# Patient Record
Sex: Male | Born: 1950 | Race: White | Hispanic: No | Marital: Married | Smoking: Current every day smoker
Health system: Southern US, Community
[De-identification: ages and names within clinical notes are randomized; demographics above are authoritative.]

## PROBLEM LIST (undated history)

## (undated) ENCOUNTER — Emergency Department (HOSPITAL_COMMUNITY): Admission: EM | Discharge status: Absent without leave | Payer: Medicare Other

## (undated) DIAGNOSIS — M1990 Unspecified osteoarthritis, unspecified site: Secondary | ICD-10-CM

## (undated) DIAGNOSIS — G893 Neoplasm related pain (acute) (chronic): Secondary | ICD-10-CM

## (undated) DIAGNOSIS — R918 Other nonspecific abnormal finding of lung field: Secondary | ICD-10-CM

## (undated) DIAGNOSIS — R112 Nausea with vomiting, unspecified: Secondary | ICD-10-CM

## (undated) DIAGNOSIS — F1021 Alcohol dependence, in remission: Secondary | ICD-10-CM

## (undated) DIAGNOSIS — E119 Type 2 diabetes mellitus without complications: Secondary | ICD-10-CM

## (undated) DIAGNOSIS — D6959 Other secondary thrombocytopenia: Secondary | ICD-10-CM

## (undated) DIAGNOSIS — J449 Chronic obstructive pulmonary disease, unspecified: Secondary | ICD-10-CM

## (undated) DIAGNOSIS — J90 Pleural effusion, not elsewhere classified: Secondary | ICD-10-CM

## (undated) DIAGNOSIS — I890 Lymphedema, not elsewhere classified: Secondary | ICD-10-CM

## (undated) DIAGNOSIS — E02 Subclinical iodine-deficiency hypothyroidism: Secondary | ICD-10-CM

## (undated) DIAGNOSIS — L02419 Cutaneous abscess of limb, unspecified: Secondary | ICD-10-CM

## (undated) DIAGNOSIS — D759 Disease of blood and blood-forming organs, unspecified: Secondary | ICD-10-CM

## (undated) DIAGNOSIS — Z720 Tobacco use: Secondary | ICD-10-CM

## (undated) DIAGNOSIS — C3490 Malignant neoplasm of unspecified part of unspecified bronchus or lung: Secondary | ICD-10-CM

## (undated) HISTORY — DX: Unspecified osteoarthritis, unspecified site: M19.90

## (undated) HISTORY — PX: LUNG CANCER SURGERY: SHX702

## (undated) HISTORY — DX: Subclinical iodine-deficiency hypothyroidism: E02

## (undated) HISTORY — DX: Chronic obstructive pulmonary disease, unspecified: J44.9

---
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
## 2006-01-23 ENCOUNTER — Emergency Department (HOSPITAL_COMMUNITY): Admission: EM | Admit: 2006-01-23 | Discharge: 2006-01-23 | Payer: Self-pay | Admitting: Emergency Medicine

## 2018-06-03 DIAGNOSIS — R918 Other nonspecific abnormal finding of lung field: Secondary | ICD-10-CM | POA: Insufficient documentation

## 2018-06-16 DIAGNOSIS — Z720 Tobacco use: Secondary | ICD-10-CM | POA: Insufficient documentation

## 2018-06-16 DIAGNOSIS — R918 Other nonspecific abnormal finding of lung field: Secondary | ICD-10-CM

## 2018-06-16 DIAGNOSIS — C3490 Malignant neoplasm of unspecified part of unspecified bronchus or lung: Secondary | ICD-10-CM

## 2018-06-16 DIAGNOSIS — E119 Type 2 diabetes mellitus without complications: Secondary | ICD-10-CM | POA: Insufficient documentation

## 2018-06-16 DIAGNOSIS — F1021 Alcohol dependence, in remission: Secondary | ICD-10-CM

## 2018-06-16 HISTORY — DX: Other nonspecific abnormal finding of lung field: R91.8

## 2018-06-16 HISTORY — DX: Type 2 diabetes mellitus without complications: E11.9

## 2018-06-16 HISTORY — DX: Tobacco use: Z72.0

## 2018-06-16 HISTORY — DX: Alcohol dependence, in remission: F10.21

## 2018-06-16 HISTORY — DX: Malignant neoplasm of unspecified part of unspecified bronchus or lung: C34.90

## 2018-07-02 DIAGNOSIS — J9819 Other pulmonary collapse: Secondary | ICD-10-CM | POA: Insufficient documentation

## 2018-07-03 DIAGNOSIS — J9600 Acute respiratory failure, unspecified whether with hypoxia or hypercapnia: Secondary | ICD-10-CM | POA: Insufficient documentation

## 2018-07-09 DIAGNOSIS — J90 Pleural effusion, not elsewhere classified: Secondary | ICD-10-CM

## 2018-07-09 HISTORY — DX: Pleural effusion, not elsewhere classified: J90

## 2018-07-16 DIAGNOSIS — G893 Neoplasm related pain (acute) (chronic): Secondary | ICD-10-CM

## 2018-07-16 HISTORY — DX: Neoplasm related pain (acute) (chronic): G89.3

## 2018-08-20 DIAGNOSIS — T451X5A Adverse effect of antineoplastic and immunosuppressive drugs, initial encounter: Secondary | ICD-10-CM | POA: Insufficient documentation

## 2018-08-20 DIAGNOSIS — D6959 Other secondary thrombocytopenia: Secondary | ICD-10-CM

## 2018-08-20 HISTORY — DX: Adverse effect of antineoplastic and immunosuppressive drugs, initial encounter: T45.1X5A

## 2018-08-20 HISTORY — DX: Other secondary thrombocytopenia: D69.59

## 2018-08-24 DIAGNOSIS — Z09 Encounter for follow-up examination after completed treatment for conditions other than malignant neoplasm: Secondary | ICD-10-CM | POA: Insufficient documentation

## 2018-10-19 ENCOUNTER — Other Ambulatory Visit: Payer: Self-pay | Admitting: Oncology

## 2018-10-19 ENCOUNTER — Other Ambulatory Visit (HOSPITAL_COMMUNITY): Payer: Self-pay | Admitting: Oncology

## 2018-10-19 DIAGNOSIS — C3400 Malignant neoplasm of unspecified main bronchus: Secondary | ICD-10-CM

## 2018-10-29 ENCOUNTER — Encounter (HOSPITAL_COMMUNITY): Payer: Self-pay

## 2018-11-12 DIAGNOSIS — R112 Nausea with vomiting, unspecified: Secondary | ICD-10-CM | POA: Insufficient documentation

## 2018-11-12 DIAGNOSIS — T451X5A Adverse effect of antineoplastic and immunosuppressive drugs, initial encounter: Secondary | ICD-10-CM | POA: Insufficient documentation

## 2018-11-12 HISTORY — DX: Nausea with vomiting, unspecified: R11.2

## 2019-01-21 DIAGNOSIS — T50905A Adverse effect of unspecified drugs, medicaments and biological substances, initial encounter: Secondary | ICD-10-CM | POA: Insufficient documentation

## 2019-01-21 DIAGNOSIS — D759 Disease of blood and blood-forming organs, unspecified: Secondary | ICD-10-CM

## 2019-01-21 HISTORY — DX: Disease of blood and blood-forming organs, unspecified: D75.9

## 2019-03-15 DIAGNOSIS — L02419 Cutaneous abscess of limb, unspecified: Secondary | ICD-10-CM | POA: Insufficient documentation

## 2019-03-15 DIAGNOSIS — L03119 Cellulitis of unspecified part of limb: Secondary | ICD-10-CM | POA: Insufficient documentation

## 2019-04-15 ENCOUNTER — Other Ambulatory Visit (HOSPITAL_COMMUNITY): Payer: Self-pay | Admitting: Radiation Oncology

## 2019-04-15 ENCOUNTER — Other Ambulatory Visit: Payer: Self-pay | Admitting: Radiation Oncology

## 2019-04-15 DIAGNOSIS — C3490 Malignant neoplasm of unspecified part of unspecified bronchus or lung: Secondary | ICD-10-CM

## 2019-04-23 ENCOUNTER — Encounter (HOSPITAL_COMMUNITY): Admission: RE | Admit: 2019-04-23 | Payer: Self-pay | Source: Ambulatory Visit

## 2019-04-23 ENCOUNTER — Encounter (HOSPITAL_COMMUNITY): Payer: Self-pay

## 2019-04-27 ENCOUNTER — Other Ambulatory Visit: Payer: Self-pay

## 2019-04-27 ENCOUNTER — Ambulatory Visit (HOSPITAL_COMMUNITY)
Admission: RE | Admit: 2019-04-27 | Discharge: 2019-04-27 | Disposition: A | Discharge status: Home / Self Care | Payer: No Typology Code available for payment source | Source: Ambulatory Visit | Attending: Radiation Oncology | Admitting: Radiation Oncology

## 2019-04-27 DIAGNOSIS — C3490 Malignant neoplasm of unspecified part of unspecified bronchus or lung: Secondary | ICD-10-CM | POA: Insufficient documentation

## 2019-04-27 LAB — GLUCOSE, CAPILLARY: Glucose-Capillary: 78 mg/dL (ref 70–99)

## 2019-04-27 MED ORDER — FLUDEOXYGLUCOSE F - 18 (FDG) INJECTION
7.7700 | Freq: Once | INTRAVENOUS | Status: AC | PRN
  Administered 2019-04-27: 7.77 via INTRAVENOUS

## 2019-05-13 DIAGNOSIS — I890 Lymphedema, not elsewhere classified: Secondary | ICD-10-CM | POA: Insufficient documentation

## 2019-05-13 DIAGNOSIS — L02419 Cutaneous abscess of limb, unspecified: Secondary | ICD-10-CM

## 2019-05-13 DIAGNOSIS — L03119 Cellulitis of unspecified part of limb: Secondary | ICD-10-CM

## 2019-05-13 HISTORY — DX: Lymphedema, not elsewhere classified: I89.0

## 2019-05-13 HISTORY — DX: Cellulitis of unspecified part of limb: L03.119

## 2019-05-13 HISTORY — DX: Cutaneous abscess of limb, unspecified: L02.419

## 2019-05-27 ENCOUNTER — Other Ambulatory Visit: Payer: Self-pay | Admitting: Family Medicine

## 2019-05-27 ENCOUNTER — Other Ambulatory Visit (HOSPITAL_COMMUNITY): Payer: Self-pay | Admitting: Family Medicine

## 2019-05-27 DIAGNOSIS — R918 Other nonspecific abnormal finding of lung field: Secondary | ICD-10-CM

## 2019-05-31 ENCOUNTER — Other Ambulatory Visit: Payer: Self-pay

## 2019-05-31 ENCOUNTER — Ambulatory Visit (HOSPITAL_COMMUNITY)
Admission: RE | Admit: 2019-05-31 | Discharge: 2019-05-31 | Disposition: A | Discharge status: Home / Self Care | Payer: No Typology Code available for payment source | Source: Ambulatory Visit | Attending: Family Medicine | Admitting: Family Medicine

## 2019-05-31 DIAGNOSIS — R918 Other nonspecific abnormal finding of lung field: Secondary | ICD-10-CM | POA: Diagnosis not present

## 2019-05-31 LAB — POCT I-STAT CREATININE: Creatinine, Ser: 1.3 mg/dL — ABNORMAL HIGH (ref 0.61–1.24)

## 2019-05-31 MED ORDER — IOHEXOL 300 MG/ML  SOLN
75.0000 mL | Freq: Once | INTRAMUSCULAR | Status: AC | PRN
  Administered 2019-05-31: 14:00:00 75 mL via INTRAVENOUS

## 2019-06-03 ENCOUNTER — Other Ambulatory Visit: Payer: Self-pay

## 2019-06-03 DIAGNOSIS — E02 Subclinical iodine-deficiency hypothyroidism: Secondary | ICD-10-CM | POA: Insufficient documentation

## 2019-06-04 ENCOUNTER — Encounter: Payer: Self-pay | Admitting: Thoracic Surgery (Cardiothoracic Vascular Surgery)

## 2019-06-04 ENCOUNTER — Other Ambulatory Visit: Payer: Self-pay

## 2019-06-04 ENCOUNTER — Institutional Professional Consult (permissible substitution) (INDEPENDENT_AMBULATORY_CARE_PROVIDER_SITE_OTHER): Payer: No Typology Code available for payment source | Admitting: Thoracic Surgery (Cardiothoracic Vascular Surgery)

## 2019-06-04 ENCOUNTER — Other Ambulatory Visit: Payer: Self-pay | Admitting: *Deleted

## 2019-06-04 VITALS — BP 93/57 | HR 64 | Temp 97.8°F | Resp 20 | Ht 70.0 in | Wt 154.0 lb

## 2019-06-04 DIAGNOSIS — J90 Pleural effusion, not elsewhere classified: Secondary | ICD-10-CM | POA: Diagnosis not present

## 2019-06-04 DIAGNOSIS — Z85118 Personal history of other malignant neoplasm of bronchus and lung: Secondary | ICD-10-CM

## 2019-06-04 NOTE — Progress Notes (Signed)
[address]       [place] [address]             [phone]                    Rishi Scroggins [facility] Medical Record #[id number] Date of Birth: 04-05-1951  Referring: Meda Klinefelter, MD Primary Care: Default, Provider, MD Primary Cardiologist: No primary care provider on file.  Chief Complaint:    Chief Complaint  Patient presents with  . Pleural Effusion    consult on removing right pleurX catheter in was placed last year Dr Tye [place] in [place] 05/31/19, PET Scan 04/27/19, currently having chemo/ therapy      History of Present Illness:    Marcus Schmidt 68 y.o. male for a right Pleurx catheter removal.  He has a history of stage IIIa non-small cell lung cancer right lung, and had a malignant pleural effusion as well.  This was originally treated at [facility] with catheter-based drainage and pleurodesis.  At that time a Pleurx catheter was placed.  This occurred in July 2020.  He has had minimal drainage from his Pleurx catheter since that point.  On most recent CT scan, he has good pleural apposition with no evidence of residual fluid.       Zubrod Score: At the time of surgery this patient's most appropriate activity status/level should be described as: []     0    Normal activity, no symptoms []     1    Restricted in physical strenuous activity but ambulatory, able to do out light work [x]     2    Ambulatory and capable of self care, unable to do work activities, up and about               >50 % of waking hours                              []     3    Only limited self care, in bed greater than 50% of waking hours []     4    Completely disabled, no self care, confined to bed or chair []     5    Moribund   Past Medical History:  Diagnosis Date  . Arthritis   . Cancer related pain 07/16/2018   per [facility] records  . Cellulitis and abscess of leg 05/13/2019   per [facility] records  . Chemotherapy induced nausea and vomiting 11/12/2018   per [facility] records   . Chemotherapy-induced thrombocytopenia 08/20/2018   per [facility] records  . COPD (chronic obstructive pulmonary disease) ([place])   . Diabetes mellitus ([place]) 06/16/2018  . Diabetes mellitus ([place]) 06/16/2018   per [facility] records  . Drug-induced cytopenia 01/21/2019   per [facility] records  . 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 Endobronchial mass 10/919   per [facility] records  . History of alcoholism ([place]) 06/16/2018   per [facility] records  . Lung cancer ([place]) 06/16/2018   [facility]  . Lymphedema of both lower extremities 05/13/2019   per [facility] records  . Pleural effusion 07/09/2018   per [facility] records  . Subclinical iodine-deficiency hypothyroidism    per [facility] records  . Tobacco use 06/16/2018   [facility]    No family history on file.   Social History   Tobacco Use  Smoking Status  Current Every Day Smoker  . Types: Cigarettes    Social History   Substance and Sexual Activity  Alcohol Use Not Currently     Allergies  Allergen Reactions  . Ceftriaxone Rash  . Ciprofloxacin Rash  . Penicillins Rash    Current Outpatient Medications  Medication Sig Dispense Refill  . albuterol (VENTOLIN HFA) 108 (90 Base) MCG/ACT inhaler Inhale into the lungs every 4 (four) hours as needed.     Marland Kitchen aspirin-acetaminophen-caffeine (EXCEDRIN MIGRAINE) 250-250-65 MG tablet Take by mouth.    . budesonide-formoterol (SYMBICORT) 80-4.5 MCG/ACT inhaler Inhale into the lungs as needed.     . docusate sodium (COLACE) 100 MG capsule Take by mouth.    . doxycycline (VIBRAMYCIN) 100 MG capsule Take 100 mg by mouth 2 (two) times daily.     . finasteride (PROSCAR) 5 MG tablet Take 1 tablet by mouth daily.    Marland Kitchen gabapentin (NEURONTIN) 400 MG capsule Take 2 capsules by mouth 3 (three) times daily. 2 caps TID    . loratadine (CLARITIN) 10 MG tablet Take 1 tablet by mouth daily as needed.    . metFORMIN (GLUCOPHAGE) 500 MG tablet Take 1,000 mg by mouth 2 (two) times daily.     Marland Kitchen morphine (MS CONTIN) 15  MG 12 hr tablet Take 1 tablet by mouth every 12 (twelve) hours.    Marland Kitchen morphine (MSIR) 15 MG tablet Take 0.5 tablets by mouth every 6 (six) hours as needed for pain.    . Multiple Vitamins-Minerals (MULTIVITAMIN ADULT PO) Take 1 tablet by mouth daily.    . promethazine (PHENERGAN) 25 MG tablet Take 1 tablet by mouth every 6 (six) hours as needed for nausea.    . tamsulosin (FLOMAX) 0.4 MG CAPS capsule Take 1 capsule by mouth daily.    Marland Kitchen triamterene-hydrochlorothiazide (DYAZIDE) 37.5-25 MG capsule Take 1 capsule by mouth daily. Prn swelling in legs      No current facility-administered medications for this visit.     Review of Systems  Constitutional: Negative.   Respiratory: Positive for cough and shortness of breath.   Cardiovascular: Positive for chest pain.  Musculoskeletal: Positive for back pain.  Neurological: Negative.      PHYSICAL EXAMINATION: BP (!) 93/57   Pulse 64   Temp 97.8 F (36.6 C) (Skin)   Resp 20   Ht 5\' 10"  (1.778 m)   Wt 154 lb (69.9 kg)   SpO2 95% Comment: RA  BMI 22.10 kg/m  Physical Exam  Constitutional: He is oriented to person, place, and time.  disheveled Presents in a wheelchair  HENT:  Head: Normocephalic and atraumatic.  Eyes: Conjunctivae and EOM are normal. No scleral icterus.  Neck: Normal range of motion. No tracheal deviation present.  Cardiovascular: Normal rate.  Respiratory: Effort normal. No respiratory distress.  GI: He exhibits no distension.  Neurological: He is alert and oriented to person, place, and time.  Skin: Skin is warm and dry.    Diagnostic Studies & Laboratory data:     Recent Radiology Findings:   Ct Chest W Contrast  Result Date: 06/01/2019 CLINICAL DATA:  Lung cancer, prior radiation therapy. EXAM: CT CHEST WITH CONTRAST TECHNIQUE: Multidetector CT imaging of the chest was performed during intravenous contrast administration. CONTRAST:  31mL OMNIPAQUE IOHEXOL 300 MG/ML  SOLN COMPARISON:  PET-CT from 04/27/2019  and CT scan from 03/02/2019 FINDINGS: Cardiovascular: Coronary, aortic arch, and branch vessel atherosclerotic vascular disease. Left Port-A-Cath tip: SVC. Mediastinum/Nodes: Stable volume loss on the right with resulting  mild rightward mediastinal shift. Right mediastinal soft tissue prominence extending around the right upper lobe bronchus and bronchus intermedius and indistinct stranding in the regional mediastinum, similar to 03/02/2019. Lungs/Pleura: Increasing right perihilar airspace opacity particularly posteriorly in the right upper lobe for example on image 65/4. Compared to the CT from 03/02/2019 is mildly reduced volume loss in the right middle lobe and a similar amount of central peribronchovascular density in the right lower lobe. Cylindrical bronchiectasis noted with bilateral airway thickening. There is a band of atelectasis in the right lower lobe which appears stable. A right pleural drain is in place and there is no significant right pleural effusion at this time. There is a band of airspace opacity in the lingula for example on image 132/4 which is new compared to recent prior exams. Branching indistinct opacities in the posterior basal segment left lower lobe appear inflammatory. Some of the more confluent airspace opacity medially in the left lower lobe is less dense than it was on 03/01/2029. Upper Abdomen: Unremarkable Musculoskeletal: Old healed right rib fractures. Mild thoracic spondylosis. IMPRESSION: 1. Increasing right perihilar airspace opacity likely associated with prior radiation therapy. Similar right eccentric mediastinal density compared to 03/02/2019. 2. The band of airspace opacity in the left lower lobe medially is improved. There is some scattered branching opacities in the posterior basal segment left lower lobe which are likely inflammatory. 3. New band of opacity in the lingula, probably from atelectasis or less likely pneumonia/inflammation. 4. Bilateral cylindrical  bronchiectasis with airway thickening. 5. Right pleural drain is in place. 6.  Aortic Atherosclerosis (ICD10-I70.0).  Coronary atherosclerosis. Electronically Signed   By: Van Clines M.D.   On: 06/01/2019 08:36       I have independently reviewed the above radiology studies  and reviewed the findings with the patient.   Recent Lab Findings: Lab Results  Component Value Date   CREATININE 1.30 (H) 05/31/2019      Assessment / Plan:   68 year old gentleman with history of stage IIIa lung cancer status post chemotherapy and radiation to the right  chest.  A Pleurx catheter was placed in July 2020 which is ready for removal given that there is no longer any fluid on cross-sectional imaging.  The felt feels close to the skin, thus this will be removed Next Wednesday in the minor procedure area.     I  spent 30 minutes with  the patient face to face and greater then 50% of the time was spent in counseling and coordination of care.    Lajuana Matte 06/04/2019 2:49 PM

## 2019-06-09 ENCOUNTER — Encounter (HOSPITAL_COMMUNITY)
Admission: RE | Disposition: A | Discharge status: Home / Self Care | Payer: Self-pay | Source: Home / Self Care | Attending: Thoracic Surgery (Cardiothoracic Vascular Surgery)

## 2019-06-09 ENCOUNTER — Ambulatory Visit (HOSPITAL_COMMUNITY)
Admission: RE | Admit: 2019-06-09 | Discharge: 2019-06-09 | Disposition: A | Discharge status: Home / Self Care | Payer: No Typology Code available for payment source | Attending: Thoracic Surgery (Cardiothoracic Vascular Surgery) | Admitting: Thoracic Surgery (Cardiothoracic Vascular Surgery)

## 2019-06-09 ENCOUNTER — Encounter (HOSPITAL_COMMUNITY): Payer: Self-pay

## 2019-06-09 DIAGNOSIS — J449 Chronic obstructive pulmonary disease, unspecified: Secondary | ICD-10-CM | POA: Diagnosis not present

## 2019-06-09 DIAGNOSIS — Z7951 Long term (current) use of inhaled steroids: Secondary | ICD-10-CM | POA: Diagnosis not present

## 2019-06-09 DIAGNOSIS — Z7982 Long term (current) use of aspirin: Secondary | ICD-10-CM | POA: Insufficient documentation

## 2019-06-09 DIAGNOSIS — Z7984 Long term (current) use of oral hypoglycemic drugs: Secondary | ICD-10-CM | POA: Diagnosis not present

## 2019-06-09 DIAGNOSIS — M1990 Unspecified osteoarthritis, unspecified site: Secondary | ICD-10-CM | POA: Insufficient documentation

## 2019-06-09 DIAGNOSIS — F17210 Nicotine dependence, cigarettes, uncomplicated: Secondary | ICD-10-CM | POA: Insufficient documentation

## 2019-06-09 DIAGNOSIS — C3491 Malignant neoplasm of unspecified part of right bronchus or lung: Secondary | ICD-10-CM | POA: Diagnosis not present

## 2019-06-09 DIAGNOSIS — Z881 Allergy status to other antibiotic agents status: Secondary | ICD-10-CM | POA: Insufficient documentation

## 2019-06-09 DIAGNOSIS — E119 Type 2 diabetes mellitus without complications: Secondary | ICD-10-CM | POA: Insufficient documentation

## 2019-06-09 DIAGNOSIS — I890 Lymphedema, not elsewhere classified: Secondary | ICD-10-CM | POA: Diagnosis not present

## 2019-06-09 DIAGNOSIS — L03119 Cellulitis of unspecified part of limb: Secondary | ICD-10-CM | POA: Diagnosis not present

## 2019-06-09 DIAGNOSIS — J910 Malignant pleural effusion: Secondary | ICD-10-CM | POA: Diagnosis not present

## 2019-06-09 DIAGNOSIS — Z79899 Other long term (current) drug therapy: Secondary | ICD-10-CM | POA: Insufficient documentation

## 2019-06-09 DIAGNOSIS — Z4682 Encounter for fitting and adjustment of non-vascular catheter: Secondary | ICD-10-CM | POA: Insufficient documentation

## 2019-06-09 DIAGNOSIS — Z880 Allergy status to penicillin: Secondary | ICD-10-CM | POA: Diagnosis not present

## 2019-06-09 DIAGNOSIS — Z79891 Long term (current) use of opiate analgesic: Secondary | ICD-10-CM | POA: Insufficient documentation

## 2019-06-09 DIAGNOSIS — J90 Pleural effusion, not elsewhere classified: Secondary | ICD-10-CM

## 2019-06-09 SURGERY — REMOVAL, CLOSED DRAINAGE CATHETER SYSTEM, PLEURAL
Anesthesia: Monitor Anesthesia Care

## 2019-06-09 NOTE — Procedures (Signed)
     [address]       [place] [address]             [phone]       Procedure: removal of right pleurx catheter Indication: Hx of advanced lung cancer with malignant effusion.  Has had no drainage for over 62yr.  CT showed no residual effusion Technique: The patient was prepped and draped in normal sterile fashion.  the soft tissue around the tube was injected with lidocaine.  The tract was dilated until the felt cuff was reached.  The tube was then removed.  Stitches were placed to close the incision.  Sterile cause was applied.   The patient tolerated the procedure without any complication.

## 2019-06-09 NOTE — H&P (Signed)
[address]  [place] [address]  [phone]    No changes since last clinic appointment Procedure room for pleurx catheter removal  Per my last note [facility] Record #[id number]  Date of Birth: 1951-02-08  Referring: Meda Klinefelter, MD  Primary Care: Default, Provider, MD  Primary Cardiologist: No primary care provider on file.  Chief Complaint:      Chief Complaint  Patient presents with  . Pleural Effusion    consult on removing right pleurX catheter in was placed last year Dr Tye Maryland in [place] 05/31/19, PET Scan 04/27/19, currently having chemo/ therapy   History of Present Illness:  Marcus Schmidt 68 y.o. male for a right Pleurx catheter removal. He has a history of stage IIIa non-small cell lung cancer right lung, and had a malignant pleural effusion as well. This was originally treated at [facility] with catheter-based drainage and pleurodesis. At that time a Pleurx catheter was placed. This occurred in July 2020. He has had minimal drainage from his Pleurx catheter since that point. On most recent CT scan, he has good pleural apposition with no evidence of residual fluid.  Zubrod Score:  At the time of surgery this patient's most appropriate activity status/level should be described as:  ? 0 Normal activity, no symptoms  ? 1 Restricted in physical strenuous activity but ambulatory, able to do out light work  ? 2 Ambulatory and capable of self care, unable to do work activities, up and about >50 % of waking hours  ? 3 Only limited self care, in bed greater than 50% of waking hours  ? 4 Completely disabled, no self care, confined to bed or chair  ? 5 Moribund      Past Medical History:  Diagnosis Date  . Arthritis   . Cancer related pain 07/16/2018   per [facility] records  . Cellulitis and abscess of leg 05/13/2019   per [facility] records  . Chemotherapy induced nausea and vomiting 11/12/2018   per [facility] records  . Chemotherapy-induced  thrombocytopenia 08/20/2018   per [facility] records  . COPD (chronic obstructive pulmonary disease) ([place])   . Diabetes mellitus ([place]) 06/16/2018  . Diabetes mellitus ([place]) 06/16/2018   per [facility] records  . Drug-induced cytopenia 01/21/2019   per [facility] records  . 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 Endobronchial mass 10/919   per [facility] records  . History of alcoholism ([place]) 06/16/2018   per [facility] records  . Lung cancer ([place]) 06/16/2018   [facility]  . Lymphedema of both lower extremities 05/13/2019   per [facility] records  . Pleural effusion 07/09/2018   per [facility] records  . Subclinical iodine-deficiency hypothyroidism    per [facility] records  . Tobacco use 06/16/2018   [facility]  No family history on file.  Social History       Tobacco Use  Smoking Status Current Every Day Smoker  . Types: Cigarettes   Social History      Substance and Sexual Activity  Alcohol Use Not Currently       Allergies  Allergen Reactions  . Ceftriaxone Rash  . Ciprofloxacin Rash  . Penicillins Rash         Current Outpatient Medications  Medication Sig Dispense Refill  . albuterol (VENTOLIN HFA) 108 (90 Base) MCG/ACT inhaler Inhale into the lungs every 4 (four) hours as needed.     Marland Kitchen aspirin-acetaminophen-caffeine (EXCEDRIN MIGRAINE) 250-250-65 MG tablet Take by  mouth.    . budesonide-formoterol (SYMBICORT) 80-4.5 MCG/ACT inhaler Inhale into the lungs as needed.     . docusate sodium (COLACE) 100 MG capsule Take by mouth.    . doxycycline (VIBRAMYCIN) 100 MG capsule Take 100 mg by mouth 2 (two) times daily.     . finasteride (PROSCAR) 5 MG tablet Take 1 tablet by mouth daily.    Marland Kitchen gabapentin (NEURONTIN) 400 MG capsule Take 2 capsules by mouth 3 (three) times daily. 2 caps TID    . loratadine (CLARITIN) 10 MG tablet Take 1 tablet by mouth daily as needed.    . metFORMIN (GLUCOPHAGE) 500 MG tablet Take 1,000 mg by mouth 2 (two) times daily.     Marland Kitchen morphine (MS CONTIN) 15 MG 12 hr tablet  Take 1 tablet by mouth every 12 (twelve) hours.    Marland Kitchen morphine (MSIR) 15 MG tablet Take 0.5 tablets by mouth every 6 (six) hours as needed for pain.    . Multiple Vitamins-Minerals (MULTIVITAMIN ADULT PO) Take 1 tablet by mouth daily.    . promethazine (PHENERGAN) 25 MG tablet Take 1 tablet by mouth every 6 (six) hours as needed for nausea.    . tamsulosin (FLOMAX) 0.4 MG CAPS capsule Take 1 capsule by mouth daily.    Marland Kitchen triamterene-hydrochlorothiazide (DYAZIDE) 37.5-25 MG capsule Take 1 capsule by mouth daily. Prn swelling in legs      No current facility-administered medications for this visit.   Review of Systems  Constitutional: Negative.  Respiratory: Positive for cough and shortness of breath.  Cardiovascular: Positive for chest pain.  Musculoskeletal: Positive for back pain.  Neurological: Negative.   PHYSICAL EXAMINATION:  BP (!) 93/57  Pulse 64  Temp 97.8 F (36.6 C) (Skin)  Resp 20  Ht 5\' 10"  (1.778 m)  Wt 154 lb (69.9 kg)  SpO2 95% Comment: RA  BMI 22.10 kg/m  Physical Exam  Constitutional: He is oriented to person, place, and time.  disheveled Presents in a wheelchair  HENT:  Head: Normocephalic and atraumatic.  Eyes: Conjunctivae and EOM are normal. No scleral icterus.  Neck: Normal range of motion. No tracheal deviation present.  Cardiovascular: Normal rate.  Respiratory: Effort normal. No respiratory distress.  GI: He exhibits no distension.  Neurological: He is alert and oriented to person, place, and time.  Skin: Skin is warm and dry.   Diagnostic Studies & Laboratory data:  Recent Radiology Findings:  Imaging Results    I have independently reviewed the above radiology studies and reviewed the findings with the patient.  Recent Lab Findings:  Recent Labs                      Assessment / Plan:  68 year old gentleman with history of stage IIIa lung cancer status post chemotherapy and radiation to the right chest. A Pleurx catheter was placed in July  2020 which is ready for removal given that there is no longer any fluid on cross-sectional imaging. The felt feels close to the skin, thus this will be removed Next Wednesday in the minor procedure area.  Lajuana Matte

## 2019-06-09 NOTE — Discharge Summary (Signed)
Physician Discharge Summary   Patient ID: Deondre Marinaro [id number] 68 y.o. 20-Dec-1950  Admit date: 06/09/2019  Discharge date and time: No discharge date for patient encounter.   Admitting Physician: Lajuana Matte, MD   Discharge Physician: Lajuana Matte   Admission Diagnoses: RESOLVED PLEURAL EFFUSION  Discharge Diagnoses: same  Admission Condition: good  Discharged Condition: good   Disposition: Discharge disposition: 01-Home or Self Care       Patient Instructions:   Activity: activity as tolerated Diet: regular diet Wound Care: keep wound clean and dry  Follow-up with Dr. Kipp Brood in 2 weeks.  SignedLajuana Matte 06/09/2019 11:39 AM

## 2019-06-09 NOTE — Progress Notes (Signed)
Patient discharged home with daughter, Kiegan Macaraeg.  Patient was wheeled out to main waiting area in a wheelchair, in stable condition.  No IVs were started.

## 2019-06-17 ENCOUNTER — Encounter: Payer: Self-pay | Admitting: Thoracic Surgery (Cardiothoracic Vascular Surgery)

## 2019-06-23 ENCOUNTER — Other Ambulatory Visit: Payer: Self-pay | Admitting: Thoracic Surgery (Cardiothoracic Vascular Surgery)

## 2019-06-23 DIAGNOSIS — J90 Pleural effusion, not elsewhere classified: Secondary | ICD-10-CM

## 2019-06-25 ENCOUNTER — Encounter: Payer: Self-pay | Admitting: Thoracic Surgery (Cardiothoracic Vascular Surgery)

## 2019-06-25 ENCOUNTER — Ambulatory Visit
Admission: RE | Admit: 2019-06-25 | Discharge: 2019-06-25 | Disposition: A | Discharge status: Home / Self Care | Payer: No Typology Code available for payment source | Source: Ambulatory Visit | Attending: Thoracic Surgery (Cardiothoracic Vascular Surgery) | Admitting: Thoracic Surgery (Cardiothoracic Vascular Surgery)

## 2019-06-25 ENCOUNTER — Other Ambulatory Visit: Payer: Self-pay

## 2019-06-25 ENCOUNTER — Ambulatory Visit (INDEPENDENT_AMBULATORY_CARE_PROVIDER_SITE_OTHER): Payer: No Typology Code available for payment source | Admitting: Thoracic Surgery (Cardiothoracic Vascular Surgery)

## 2019-06-25 VITALS — BP 94/60 | HR 62 | Temp 97.7°F | Resp 20 | Ht 71.0 in | Wt 154.0 lb

## 2019-06-25 DIAGNOSIS — J90 Pleural effusion, not elsewhere classified: Secondary | ICD-10-CM

## 2019-06-25 DIAGNOSIS — Z85118 Personal history of other malignant neoplasm of bronchus and lung: Secondary | ICD-10-CM

## 2019-06-25 NOTE — Progress Notes (Signed)
     [address]       [place] [address]             [phone]      Marcus Schmidt comes in for his final Pleurx catheter.  He has no complaints.  Vitals:   06/25/19 1035  BP: 94/60  Pulse: 62  Resp: 20  Temp: 97.7 F (36.5 C)  SpO2: 92%   Stitch was removed Incisions well-healed  68 year old male status post Pleurx catheter removal No issues follow-up with primary care physician

## 2019-06-28 ENCOUNTER — Other Ambulatory Visit: Payer: Self-pay | Admitting: Thoracic Surgery (Cardiothoracic Vascular Surgery)

## 2019-07-26 ENCOUNTER — Other Ambulatory Visit (HOSPITAL_COMMUNITY): Payer: Self-pay | Admitting: Oncology

## 2019-07-26 DIAGNOSIS — C3490 Malignant neoplasm of unspecified part of unspecified bronchus or lung: Secondary | ICD-10-CM

## 2019-08-16 ENCOUNTER — Other Ambulatory Visit: Payer: Self-pay

## 2019-08-16 ENCOUNTER — Encounter (HOSPITAL_COMMUNITY)
Admission: RE | Admit: 2019-08-16 | Discharge: 2019-08-16 | Disposition: A | Discharge status: Home / Self Care | Payer: No Typology Code available for payment source | Source: Ambulatory Visit | Attending: Oncology | Admitting: Oncology

## 2019-08-16 ENCOUNTER — Encounter (HOSPITAL_COMMUNITY): Payer: Self-pay

## 2019-08-16 DIAGNOSIS — C3490 Malignant neoplasm of unspecified part of unspecified bronchus or lung: Secondary | ICD-10-CM | POA: Insufficient documentation

## 2019-08-26 ENCOUNTER — Other Ambulatory Visit (HOSPITAL_COMMUNITY): Payer: No Typology Code available for payment source

## 2019-08-26 ENCOUNTER — Encounter (HOSPITAL_COMMUNITY): Admission: RE | Admit: 2019-08-26 | Payer: No Typology Code available for payment source | Source: Ambulatory Visit

## 2019-08-26 ENCOUNTER — Encounter (HOSPITAL_COMMUNITY): Payer: No Typology Code available for payment source

## 2019-08-30 ENCOUNTER — Other Ambulatory Visit: Payer: Self-pay

## 2019-08-30 ENCOUNTER — Encounter (HOSPITAL_COMMUNITY)
Admission: RE | Admit: 2019-08-30 | Discharge: 2019-08-30 | Disposition: A | Discharge status: Home / Self Care | Payer: No Typology Code available for payment source | Source: Ambulatory Visit | Attending: Oncology | Admitting: Oncology

## 2019-08-30 DIAGNOSIS — C3490 Malignant neoplasm of unspecified part of unspecified bronchus or lung: Secondary | ICD-10-CM | POA: Diagnosis present

## 2019-08-30 LAB — GLUCOSE, CAPILLARY: Glucose-Capillary: 84 mg/dL (ref 70–99)

## 2019-08-30 MED ORDER — FLUDEOXYGLUCOSE F - 18 (FDG) INJECTION
8.4000 | Freq: Once | INTRAVENOUS | Status: AC
  Administered 2019-08-30: 8.4 via INTRAVENOUS

## 2019-08-31 ENCOUNTER — Other Ambulatory Visit (HOSPITAL_COMMUNITY): Payer: Self-pay | Admitting: Oncology

## 2019-08-31 DIAGNOSIS — C3490 Malignant neoplasm of unspecified part of unspecified bronchus or lung: Secondary | ICD-10-CM

## 2019-09-03 ENCOUNTER — Other Ambulatory Visit (HOSPITAL_COMMUNITY): Payer: No Typology Code available for payment source

## 2019-11-09 ENCOUNTER — Other Ambulatory Visit: Payer: Self-pay | Admitting: Oncology

## 2019-11-09 ENCOUNTER — Other Ambulatory Visit (HOSPITAL_COMMUNITY): Payer: Self-pay | Admitting: Oncology

## 2019-11-09 DIAGNOSIS — R911 Solitary pulmonary nodule: Secondary | ICD-10-CM

## 2019-11-17 ENCOUNTER — Other Ambulatory Visit: Payer: Self-pay

## 2019-11-17 ENCOUNTER — Ambulatory Visit (HOSPITAL_COMMUNITY)
Admission: RE | Admit: 2019-11-17 | Discharge: 2019-11-17 | Disposition: A | Discharge status: Home / Self Care | Payer: No Typology Code available for payment source | Source: Ambulatory Visit | Attending: Oncology | Admitting: Oncology

## 2019-11-17 DIAGNOSIS — R911 Solitary pulmonary nodule: Secondary | ICD-10-CM

## 2019-11-17 LAB — GLUCOSE, CAPILLARY: Glucose-Capillary: 87 mg/dL (ref 70–99)

## 2019-11-17 MED ORDER — FLUDEOXYGLUCOSE F - 18 (FDG) INJECTION
8.4000 | Freq: Once | INTRAVENOUS | Status: AC | PRN
  Administered 2019-11-17: 8.4 via INTRAVENOUS

## 2020-06-05 ENCOUNTER — Other Ambulatory Visit: Payer: Self-pay | Admitting: Oncology

## 2020-06-05 ENCOUNTER — Other Ambulatory Visit (HOSPITAL_COMMUNITY): Payer: Self-pay | Admitting: Oncology

## 2020-06-05 DIAGNOSIS — C3490 Malignant neoplasm of unspecified part of unspecified bronchus or lung: Secondary | ICD-10-CM

## 2020-06-14 ENCOUNTER — Encounter (HOSPITAL_COMMUNITY): Payer: Self-pay

## 2020-06-14 ENCOUNTER — Ambulatory Visit (HOSPITAL_COMMUNITY): Admission: RE | Admit: 2020-06-14 | Payer: No Typology Code available for payment source | Source: Ambulatory Visit

## 2020-08-04 ENCOUNTER — Encounter (HOSPITAL_COMMUNITY): Payer: Self-pay

## 2020-08-04 ENCOUNTER — Ambulatory Visit (HOSPITAL_COMMUNITY): Admission: RE | Admit: 2020-08-04 | Payer: No Typology Code available for payment source | Source: Ambulatory Visit

## 2021-01-17 ENCOUNTER — Other Ambulatory Visit (HOSPITAL_COMMUNITY): Payer: Self-pay | Admitting: Oncology

## 2021-01-17 DIAGNOSIS — C3490 Malignant neoplasm of unspecified part of unspecified bronchus or lung: Secondary | ICD-10-CM

## 2021-01-31 ENCOUNTER — Other Ambulatory Visit: Payer: Self-pay

## 2021-01-31 ENCOUNTER — Ambulatory Visit (HOSPITAL_COMMUNITY)
Admission: RE | Admit: 2021-01-31 | Discharge: 2021-01-31 | Disposition: A | Discharge status: Home / Self Care | Payer: No Typology Code available for payment source | Source: Ambulatory Visit | Attending: Oncology | Admitting: Oncology

## 2021-01-31 DIAGNOSIS — C3490 Malignant neoplasm of unspecified part of unspecified bronchus or lung: Secondary | ICD-10-CM | POA: Insufficient documentation

## 2021-01-31 LAB — GLUCOSE, CAPILLARY: Glucose-Capillary: 109 mg/dL — ABNORMAL HIGH (ref 70–99)

## 2021-01-31 MED ORDER — FLUDEOXYGLUCOSE F - 18 (FDG) INJECTION
7.2000 | Freq: Once | INTRAVENOUS | Status: AC | PRN
  Administered 2021-01-31: 7.2 via INTRAVENOUS

## 2021-09-18 ENCOUNTER — Other Ambulatory Visit (HOSPITAL_COMMUNITY): Payer: Self-pay | Admitting: Hematology and Oncology

## 2021-09-18 DIAGNOSIS — C3481 Malignant neoplasm of overlapping sites of right bronchus and lung: Secondary | ICD-10-CM

## 2021-10-04 ENCOUNTER — Encounter (HOSPITAL_COMMUNITY): Payer: No Typology Code available for payment source

## 2021-10-11 ENCOUNTER — Encounter (HOSPITAL_COMMUNITY): Admission: RE | Admit: 2021-10-11 | Payer: No Typology Code available for payment source | Source: Ambulatory Visit

## 2021-10-18 ENCOUNTER — Other Ambulatory Visit: Payer: Self-pay

## 2021-10-18 ENCOUNTER — Encounter (HOSPITAL_COMMUNITY)
Admission: RE | Admit: 2021-10-18 | Discharge: 2021-10-18 | Disposition: A | Discharge status: Home / Self Care | Payer: No Typology Code available for payment source | Source: Ambulatory Visit | Attending: Hematology and Oncology | Admitting: Hematology and Oncology

## 2021-10-18 DIAGNOSIS — C3481 Malignant neoplasm of overlapping sites of right bronchus and lung: Secondary | ICD-10-CM

## 2021-10-18 MED ORDER — FLUDEOXYGLUCOSE F - 18 (FDG) INJECTION
8.4200 | Freq: Once | INTRAVENOUS | Status: AC | PRN
  Administered 2021-10-18: 8.42 via INTRAVENOUS

## 2022-02-23 DEATH — deceased

## 2022-05-16 IMAGING — PT NM PET TUM IMG RESTAG (PS) SKULL BASE T - THIGH
1 of 7 series · 1 of 25 positions shown · non-contrast
Comparison: PET 01/31/2021 and CT chest 09/06/2021.

CLINICAL DATA: Subsequent treatment strategy for lung cancer, last
chemotherapy 10/09/2021.

EXAM:
NUCLEAR MEDICINE PET SKULL BASE TO THIGH
TECHNIQUE: 8.4 mCi F-18 FDG was injected intravenously. Full-ring PET imaging
was performed from the skull base to thigh after the radiotracer. CT
data was obtained and used for attenuation correction and anatomic
localization.
Fasting blood glucose: 148 mg/dl

[Series 3: ctac · axial · 3.0mm · 0.98mm/px · 1 of 293 slices shown]
[im 293/293  brain]
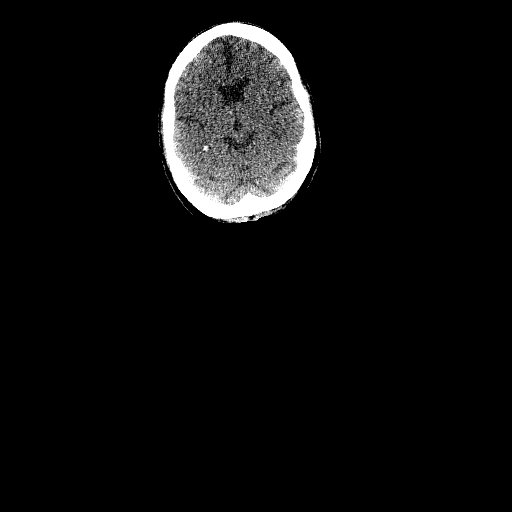

[1 of 25 positions shown; findings below may reference images not displayed]

FINDINGS: Note: Delay in interpretation due to technical difficulties loading
the prior PET in [employer] as well as needing the prior CT chest for
comparison.

Mediastinal blood pool activity: SUV max

Liver activity: SUV max NA

NECK:

No hypermetabolic lymph nodes. Scattered muscular activity is likely
physiologic.

Incidental CT findings:

Air-fluid levels in the maxillary sinuses.

CHEST:

No hypermetabolic mediastinal, hilar or axillary lymph nodes.
Decreasing hypermetabolism within collapse/consolidation in the
posteroinferior right hemithorax, SUV max 6.0, compared to
previously.

Incidental CT findings:

Left IJ Port-A-Cath terminates in the SVC. Atherosclerotic
calcification of the aorta, aortic valve and coronary arteries.
Heart size normal. No pericardial or pleural effusion. Probable
subpleural scarring in the anterior left upper lobe (7/41).

ABDOMEN/PELVIS:

No abnormal hypermetabolism in the liver, adrenal glands, spleen or
pancreas. No hypermetabolic lymph nodes.

Incidental CT findings:

Liver is unremarkable. Stones in the gallbladder. Adrenal glands are
unremarkable. Tiny bilateral renal stones. Spleen, pancreas, stomach
and bowel are unremarkable. Atherosclerotic calcification of the
aorta.

SKELETON:

No abnormal hypermetabolism. Again, scattered muscular uptake is
considered physiologic.

Incidental CT findings:

Degenerative changes in the spine.
IMPRESSION: 1. Decreasing hypermetabolism within post treatment
collapse/consolidation in the right hemithorax, indicative of
favorable treatment response. No evidence metastatic disease.
2. Cholelithiasis.
3. Tiny bilateral renal stones.
4. Air-fluid levels in the maxillary sinuses.
5. Aortic atherosclerosis ([id number]). Coronary artery
calcification.
